# Patient Record
Sex: Male | Born: 1975 | Race: White | Hispanic: No | Marital: Married | Smoking: Former smoker
Health system: Southern US, Community
[De-identification: ages and names within clinical notes are randomized; demographics above are authoritative.]

## PROBLEM LIST (undated history)

## (undated) DIAGNOSIS — S5292XA Unspecified fracture of left forearm, initial encounter for closed fracture: Secondary | ICD-10-CM

## (undated) DIAGNOSIS — B019 Varicella without complication: Secondary | ICD-10-CM

## (undated) HISTORY — DX: Varicella without complication: B01.9

## (undated) HISTORY — DX: Unspecified fracture of left forearm, initial encounter for closed fracture: S52.92XA

---
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
## 2008-09-30 DIAGNOSIS — S5292XA Unspecified fracture of left forearm, initial encounter for closed fracture: Secondary | ICD-10-CM

## 2008-09-30 HISTORY — DX: Unspecified fracture of left forearm, initial encounter for closed fracture: S52.92XA

## 2009-03-11 ENCOUNTER — Emergency Department (HOSPITAL_COMMUNITY): Admission: EM | Admit: 2009-03-11 | Discharge: 2009-03-11 | Payer: Self-pay | Admitting: Emergency Medicine

## 2011-09-02 ENCOUNTER — Encounter: Payer: Self-pay | Admitting: Internal Medicine

## 2011-09-02 ENCOUNTER — Ambulatory Visit (INDEPENDENT_AMBULATORY_CARE_PROVIDER_SITE_OTHER): Payer: BC Managed Care – PPO | Admitting: Internal Medicine

## 2011-09-02 DIAGNOSIS — F17200 Nicotine dependence, unspecified, uncomplicated: Secondary | ICD-10-CM

## 2011-09-02 DIAGNOSIS — Z800 Family history of malignant neoplasm of digestive organs: Secondary | ICD-10-CM | POA: Insufficient documentation

## 2011-09-02 DIAGNOSIS — Z0000 Encounter for general adult medical examination without abnormal findings: Secondary | ICD-10-CM

## 2011-09-02 DIAGNOSIS — E663 Overweight: Secondary | ICD-10-CM

## 2011-09-02 LAB — CBC WITH DIFFERENTIAL/PLATELET
Basophils Absolute: 0 10*3/uL (ref 0.0–0.1)
Basophils Relative: 0.3 % (ref 0.0–3.0)
HCT: 44.1 % (ref 39.0–52.0)
Hemoglobin: 15.1 g/dL (ref 13.0–17.0)
Lymphocytes Relative: 19.9 % (ref 12.0–46.0)
Lymphs Abs: 1.4 10*3/uL (ref 0.7–4.0)
MCHC: 34.3 g/dL (ref 30.0–36.0)
Monocytes Relative: 9.6 % (ref 3.0–12.0)
Neutro Abs: 4.7 10*3/uL (ref 1.4–7.7)
RBC: 4.95 Mil/uL (ref 4.22–5.81)
RDW: 12.5 % (ref 11.5–14.6)

## 2011-09-02 LAB — LIPID PANEL
Cholesterol: 199 mg/dL (ref 0–200)
HDL: 37.9 mg/dL — ABNORMAL LOW (ref >39.00)
Triglycerides: 104 mg/dL (ref 0.0–149.0)
VLDL: 20.8 mg/dL (ref 0.0–40.0)

## 2011-09-02 LAB — COMPREHENSIVE METABOLIC PANEL
BUN: 11 mg/dL (ref 6–23)
CO2: 28 mEq/L (ref 19–32)
Calcium: 9.3 mg/dL (ref 8.4–10.5)
Chloride: 104 mEq/L (ref 96–112)
Creatinine, Ser: 0.9 mg/dL (ref 0.4–1.5)
GFR: 98.97 mL/min (ref >60.00)
Glucose, Bld: 94 mg/dL (ref 70–99)
Total Bilirubin: 1 mg/dL (ref 0.3–1.2)

## 2011-09-02 NOTE — Progress Notes (Signed)
  Subjective:    Patient ID: Sean Fuller, male    DOB: 1975-10-13, 35 y.o.   MRN: [id number]  HPI 35 YO male presents to establish care.  No complaints today. No chronic medical issues. 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  Chews tobacco.   No outpatient encounter prescriptions on file as of 09/02/2011.     Review of Systems  Constitutional: Negative for fever, chills, activity change, appetite change, fatigue and unexpected weight change.  Eyes: Negative for visual disturbance.  Respiratory: Negative for cough and shortness of breath.   Cardiovascular: Negative for chest pain, palpitations and leg swelling.  Gastrointestinal: Negative for abdominal pain and abdominal distention.  Genitourinary: Negative for dysuria, urgency and difficulty urinating.  Musculoskeletal: Negative for arthralgias and gait problem.  Skin: Negative for color change and rash.  Hematological: Negative for adenopathy.  Psychiatric/Behavioral: Negative for sleep disturbance and dysphoric mood. The patient is not nervous/anxious.    BP 112/80  Pulse 73  Temp(Src) 98.1 F (36.7 C) (Oral)  Ht 5\' 8"  (1.727 m)  Wt 212 lb (96.163 kg)  BMI 32.23 kg/m2  SpO2 97%     Objective:   Physical Exam  Constitutional: He is oriented to person, place, and time. He appears well-developed and well-nourished. No distress.  HENT:  Head: Normocephalic and atraumatic.  Right Ear: External ear normal.  Left Ear: External ear normal.  Nose: Nose normal.  Mouth/Throat: Oropharynx is clear and moist. No oropharyngeal exudate.  Eyes: Conjunctivae and EOM are normal. Pupils are equal, round, and reactive to light. Right eye exhibits no discharge. Left eye exhibits no discharge. No scleral icterus.  Neck: Normal range of motion. Neck supple. Carotid bruit is not present. No tracheal deviation present. No thyromegaly present.  Cardiovascular: Normal rate, regular rhythm  and normal heart sounds.  Exam reveals no gallop and no friction rub.   No murmur heard. Pulmonary/Chest: Effort normal and breath sounds normal. No respiratory distress. He has no wheezes. He has no rales. He exhibits no tenderness.  Abdominal: Soft. Bowel sounds are normal. He exhibits no distension and no mass. There is no tenderness. There is no rebound and no guarding.  Musculoskeletal: Normal range of motion. He exhibits no edema.  Lymphadenopathy:    He has no cervical adenopathy.  Neurological: He is alert and oriented to person, place, and time. No cranial nerve deficit. Coordination normal.  Skin: Skin is warm and dry. No rash noted. He is not diaphoretic. No erythema. No pallor.  Psychiatric: He has a normal mood and affect. His behavior is normal. Judgment and thought content normal.          Assessment & Plan:  1. General exam - Exam normal today. Will check CBC, CMP, lipids.  Recommended flu and TDAP vaccine, pt declines. Recommended for pt to stop tobacco use.  Recommended moderate exercise most days of week with goal of 10-20lb weight loss. Follow up 1 year and prn.  2. Family h/o esophageal cancer - Recommended referral to GI to discuss screening upper endoscopy. Pt will call when ready for this.

## 2015-06-30 ENCOUNTER — Encounter (HOSPITAL_COMMUNITY): Payer: Self-pay | Admitting: *Deleted

## 2015-06-30 ENCOUNTER — Emergency Department (HOSPITAL_COMMUNITY): Payer: BLUE CROSS/BLUE SHIELD

## 2015-06-30 ENCOUNTER — Emergency Department (HOSPITAL_COMMUNITY)
Admission: EM | Admit: 2015-06-30 | Discharge: 2015-06-30 | Disposition: A | Discharge status: Home / Self Care | Payer: BLUE CROSS/BLUE SHIELD | Attending: Emergency Medicine | Admitting: Emergency Medicine

## 2015-06-30 DIAGNOSIS — S61211A Laceration without foreign body of left index finger without damage to nail, initial encounter: Secondary | ICD-10-CM | POA: Insufficient documentation

## 2015-06-30 DIAGNOSIS — Z87891 Personal history of nicotine dependence: Secondary | ICD-10-CM | POA: Insufficient documentation

## 2015-06-30 DIAGNOSIS — Y998 Other external cause status: Secondary | ICD-10-CM | POA: Diagnosis not present

## 2015-06-30 DIAGNOSIS — Z23 Encounter for immunization: Secondary | ICD-10-CM | POA: Insufficient documentation

## 2015-06-30 DIAGNOSIS — Y92410 Unspecified street and highway as the place of occurrence of the external cause: Secondary | ICD-10-CM | POA: Diagnosis not present

## 2015-06-30 DIAGNOSIS — S61422A Laceration with foreign body of left hand, initial encounter: Secondary | ICD-10-CM | POA: Diagnosis not present

## 2015-06-30 DIAGNOSIS — Y9389 Activity, other specified: Secondary | ICD-10-CM | POA: Insufficient documentation

## 2015-06-30 DIAGNOSIS — S6992XA Unspecified injury of left wrist, hand and finger(s), initial encounter: Secondary | ICD-10-CM | POA: Diagnosis present

## 2015-06-30 DIAGNOSIS — S41112A Laceration without foreign body of left upper arm, initial encounter: Secondary | ICD-10-CM

## 2015-06-30 LAB — PROTIME-INR
INR: 1.16 (ref 0.00–1.49)
Prothrombin Time: 15 seconds (ref 11.6–15.2)

## 2015-06-30 LAB — COMPREHENSIVE METABOLIC PANEL
ALT: 17 U/L (ref 17–63)
AST: 21 U/L (ref 15–41)
Albumin: 3.5 g/dL (ref 3.5–5.0)
Alkaline Phosphatase: 65 U/L (ref 38–126)
Anion gap: 13 (ref 5–15)
BUN: 10 mg/dL (ref 6–20)
CO2: 19 mmol/L — ABNORMAL LOW (ref 22–32)
Calcium: 9 mg/dL (ref 8.9–10.3)
Chloride: 104 mmol/L (ref 101–111)
Creatinine, Ser: 0.92 mg/dL (ref 0.61–1.24)
GFR calc Af Amer: >60 mL/min (ref >60)
GFR calc non Af Amer: >60 mL/min (ref >60)
Glucose, Bld: 130 mg/dL — ABNORMAL HIGH (ref 65–99)
Potassium: 3.1 mmol/L — ABNORMAL LOW (ref 3.5–5.1)
Sodium: 136 mmol/L (ref 135–145)
Total Bilirubin: 0.6 mg/dL (ref 0.3–1.2)
Total Protein: 5.7 g/dL — ABNORMAL LOW (ref 6.5–8.1)

## 2015-06-30 LAB — CBC
HCT: 38.1 % — ABNORMAL LOW (ref 39.0–52.0)
Hemoglobin: 13.2 g/dL (ref 13.0–17.0)
MCH: 29.7 pg (ref 26.0–34.0)
MCHC: 34.6 g/dL (ref 30.0–36.0)
MCV: 85.8 fL (ref 78.0–100.0)
Platelets: 267 10*3/uL (ref 150–400)
RBC: 4.44 MIL/uL (ref 4.22–5.81)
RDW: 12.2 % (ref 11.5–15.5)
WBC: 6.7 10*3/uL (ref 4.0–10.5)

## 2015-06-30 LAB — ETHANOL: Alcohol, Ethyl (B): 6 mg/dL — ABNORMAL HIGH (ref <5)

## 2015-06-30 LAB — CDS SEROLOGY

## 2015-06-30 MED ORDER — TETANUS-DIPHTH-ACELL PERTUSSIS 5-2.5-18.5 LF-MCG/0.5 IM SUSP: 0.5000 mL | Freq: Once | INTRAMUSCULAR | Status: AC

## 2015-06-30 MED ORDER — TETANUS-DIPHTH-ACELL PERTUSSIS 5-2.5-18.5 LF-MCG/0.5 IM SUSP
0.5000 mL | Freq: Once | INTRAMUSCULAR | Status: AC
  Administered 2015-06-30: 0.5 mL via INTRAMUSCULAR
  Filled 2015-06-30: qty 0.5

## 2015-06-30 MED ORDER — LIDOCAINE-EPINEPHRINE 1 %-1:100000 IJ SOLN
10.0000 mL | Freq: Once | INTRAMUSCULAR | Status: AC
  Administered 2015-06-30: 10 mL via INTRADERMAL

## 2015-06-30 NOTE — ED Notes (Signed)
Pt. Left with all belongings and refused wheelchair. Discharge instructions were reviewed and all questions were answered.  
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
## 2015-06-30 NOTE — Discharge Instructions (Signed)

## 2015-06-30 NOTE — Progress Notes (Signed)
Chaplain escorted family to room B.

## 2015-07-01 LAB — SAMPLE TO BLOOD BANK

## 2015-07-03 ENCOUNTER — Encounter: Payer: Self-pay | Admitting: Internal Medicine
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
